# Patient Record
Sex: Male | Born: 1990 | Race: White | Hispanic: No | Marital: Single | Smoking: Never smoker
Health system: Southern US, Community
[De-identification: ages and names within clinical notes are randomized; demographics above are authoritative.]

---
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
## 2016-04-18 ENCOUNTER — Emergency Department (HOSPITAL_COMMUNITY)
Admission: EM | Admit: 2016-04-18 | Discharge: 2016-04-18 | Disposition: A | Discharge status: Home / Self Care | Payer: BC Managed Care – PPO | Attending: Emergency Medicine | Admitting: Emergency Medicine

## 2016-04-18 ENCOUNTER — Encounter (HOSPITAL_COMMUNITY): Payer: Self-pay | Admitting: *Deleted

## 2016-04-18 DIAGNOSIS — R112 Nausea with vomiting, unspecified: Secondary | ICD-10-CM | POA: Insufficient documentation

## 2016-04-18 DIAGNOSIS — Z5321 Procedure and treatment not carried out due to patient leaving prior to being seen by health care provider: Secondary | ICD-10-CM | POA: Diagnosis not present

## 2016-04-18 LAB — CBC
HCT: 44.9 % (ref 39.0–52.0)
Hemoglobin: 16.3 g/dL (ref 13.0–17.0)
MCH: 30.1 pg (ref 26.0–34.0)
MCHC: 36.3 g/dL — ABNORMAL HIGH (ref 30.0–36.0)
MCV: 83 fL (ref 78.0–100.0)
Platelets: 232 10*3/uL (ref 150–400)
RBC: 5.41 MIL/uL (ref 4.22–5.81)
RDW: 12.9 % (ref 11.5–15.5)
WBC: 9.1 10*3/uL (ref 4.0–10.5)

## 2016-04-18 LAB — COMPREHENSIVE METABOLIC PANEL
ALT: 17 U/L (ref 17–63)
AST: 20 U/L (ref 15–41)
Albumin: 5 g/dL (ref 3.5–5.0)
Alkaline Phosphatase: 70 U/L (ref 38–126)
Anion gap: 10 (ref 5–15)
BUN: 20 mg/dL (ref 6–20)
CO2: 23 mmol/L (ref 22–32)
Calcium: 9.7 mg/dL (ref 8.9–10.3)
Chloride: 104 mmol/L (ref 101–111)
Creatinine, Ser: 1.29 mg/dL — ABNORMAL HIGH (ref 0.61–1.24)
GFR calc Af Amer: >60 mL/min (ref >60)
GFR calc non Af Amer: >60 mL/min (ref >60)
Glucose, Bld: 117 mg/dL — ABNORMAL HIGH (ref 65–99)
POTASSIUM: 3.7 mmol/L (ref 3.5–5.1)
Sodium: 137 mmol/L (ref 135–145)
Total Bilirubin: 1.1 mg/dL (ref 0.3–1.2)
Total Protein: 7.4 g/dL (ref 6.5–8.1)

## 2016-04-18 LAB — LIPASE, BLOOD: Lipase: 48 U/L (ref 11–51)

## 2016-04-18 NOTE — ED Triage Notes (Signed)
Pt reports n/v/d with generalized body "carmping" today.  Unable to eat or drink d/t nausea.

## 2019-05-09 DIAGNOSIS — F902 Attention-deficit hyperactivity disorder, combined type: Secondary | ICD-10-CM | POA: Diagnosis not present

## 2019-06-16 ENCOUNTER — Ambulatory Visit: Payer: BC Managed Care – PPO | Attending: Internal Medicine

## 2019-06-16 DIAGNOSIS — Z23 Encounter for immunization: Secondary | ICD-10-CM

## 2019-06-16 NOTE — Progress Notes (Signed)
   [facility]ov[facility] Vaccination [facility]  Name:  Jon Myers    MRN: [id number] DOB: 11-May-1990  06/16/2019  Jon Myers was observed post Covid-19 immunization for 15 minutes without incident. He was provided with Vaccine Information Sheet and instruction to access the V-Safe system.   Jon Myers was instructed to call 911 with any severe reactions post vaccine: Marland Kitchen Difficulty breathing  . Swelling of face and throat  . A fast heartbeat  . A bad rash all over body  . Dizziness and weakness   Immunizations Administered    Name Date Dose VIS Date Route   Pfizer COVID-19 Vaccine 06/16/2019 10:39 AM 0.3 mL 03/04/2019 Intramuscular   Manufacturer: [employer], [employer]   Lot: [id number]   NDC: [address]-9

## 2019-07-11 ENCOUNTER — Ambulatory Visit: Payer: BC Managed Care – PPO | Attending: Internal Medicine

## 2019-07-11 DIAGNOSIS — Z23 Encounter for immunization: Secondary | ICD-10-CM

## 2019-07-11 NOTE — Progress Notes (Signed)
   Covid-19 Vaccination Clinic  Name:  Jon Myers    MRN: [id number] DOB: 1991-02-27  07/11/2019  [name] was observed post Covid-19 immunization for 15 minutes without incident. He was provided with Vaccine Information Sheet and instruction to access the V-Safe system.   [name] was instructed to call 911 with any severe reactions post vaccine: Marland Kitchen Difficulty breathing  . Swelling of face and throat  . A fast heartbeat  . A bad rash all over body  . Dizziness and weakness   Immunizations Administered    Name Date Dose VIS Date Route   Pfizer COVID-19 Vaccine 07/11/2019 11:23 AM 0.3 mL 05/18/2018 Intramuscular   Manufacturer: [employer], [employer]   Lot: [id number]   NDC: [address]-5
